# Patient Record
Sex: Male | Born: 2005 | Race: Black or African American | Hispanic: No | Marital: Single | Smoking: Never smoker
Health system: Southern US, Community
[De-identification: ages and names within clinical notes are randomized; demographics above are authoritative.]

## PROBLEM LIST (undated history)

## (undated) DIAGNOSIS — T7840XA Allergy, unspecified, initial encounter: Secondary | ICD-10-CM

## (undated) DIAGNOSIS — J45909 Unspecified asthma, uncomplicated: Secondary | ICD-10-CM

## (undated) HISTORY — PX: TYMPANOSTOMY TUBE PLACEMENT: SHX32

## (undated) HISTORY — PX: ADENOIDECTOMY: SUR15

## (undated) HISTORY — PX: TONSILLECTOMY: SUR1361

## (undated) HISTORY — DX: Allergy, unspecified, initial encounter: T78.40XA

---
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
## 2024-07-09 ENCOUNTER — Other Ambulatory Visit: Payer: Self-pay

## 2024-07-09 ENCOUNTER — Ambulatory Visit
Admission: EM | Admit: 2024-07-09 | Discharge: 2024-07-09 | Disposition: A | Discharge status: Home / Self Care | Attending: Physician Assistant | Admitting: Physician Assistant

## 2024-07-09 DIAGNOSIS — S61314A Laceration without foreign body of right ring finger with damage to nail, initial encounter: Secondary | ICD-10-CM | POA: Diagnosis not present

## 2024-07-09 DIAGNOSIS — Z23 Encounter for immunization: Secondary | ICD-10-CM

## 2024-07-09 MED ORDER — ACETAMINOPHEN 325 MG PO TABS
650.0000 mg | ORAL_TABLET | Freq: Once | ORAL | Status: AC
  Administered 2024-07-09: 650 mg via ORAL

## 2024-07-09 MED ORDER — TETANUS-DIPHTH-ACELL PERTUSSIS 5-2.5-18.5 LF-MCG/0.5 IM SUSY
0.5000 mL | PREFILLED_SYRINGE | Freq: Once | INTRAMUSCULAR | Status: AC
  Administered 2024-07-09: 0.5 mL via INTRAMUSCULAR

## 2024-07-09 NOTE — Discharge Instructions (Addendum)
 You were seen today for a laceration (cut) to your right ring finger We have cleansed the wound and closed it with a skin glue called Dermabond. You will not need to return to urgent care for removal. The Dermabond will come off on its own over the next 5-7 days. You can, and should, wash your hands like you normally would. Try not to submerge your finger, like in a bath or swimming, until it is completely healed. You can take Tylenol  and Ibuprofen  for pain If you notice any of the following please return to urgent care or go to the ED: swelling of the finger, inability to move the finger, severe pain, redness of the finger, drainage that looks like pus, fever and chills.

## 2024-07-09 NOTE — ED Provider Notes (Signed)
 [facility]    CSN: [id number] Arrival date & time: 07/09/24  1253      History   Chief Complaint Chief Complaint  Patient presents with   Finger Laceration     HPI Jon Cortez is a 18 y.o. male.   HPI  Pt presents today for concerns of a laceration to right ring finger that occurred approx 30 minutes PTA  He states he was at work and was cutting onions and accidentally cut his finger.  He washed the wound with water a few times at work  He applied paper towels to the area for bleeding control    Past Medical History:  Diagnosis Date   Allergy    Asthma     Patient Active Problem List   Diagnosis Date Noted   Asthma 02/20/2020    Past Surgical History:  Procedure Laterality Date   ADENOIDECTOMY     TONSILLECTOMY     TYMPANOSTOMY TUBE PLACEMENT         Home Medications    Prior to Admission medications   Medication Sig Start Date End Date Taking? Authorizing Provider  albuterol  (PROVENTIL ) (2.5 MG/3ML) 0.083% nebulizer solution Take 3 mLs (2.5 mg total) by nebulization every 4 (four) hours as needed for wheezing or shortness of breath. 02/20/20   Prentiss Dorothyann Maxwell, MD  loratadine (CLARITIN) 5 MG/5ML syrup Take 10 mg by mouth daily as needed for allergies.     [provider]  olopatadine  (PATANOL) 0.1 % ophthalmic solution Place 1 drop into both eyes 2 (two) times daily. Patient not taking: Reported on 02/21/2021 02/14/17   Kozlow, Eric J, MD  PROAIR  HFA 108 [phone] Base) MCG/ACT inhaler Inhale 2 puffs into the lungs every 6 (six) hours as needed for wheezing or shortness of breath. 08/18/20   Prentiss Dorothyann Maxwell, MD    Family History Family History  Problem Relation Age of Onset   Asthma Brother    Allergic rhinitis Brother    Heart disease Maternal Grandmother    Hypertension Maternal Grandmother    Heart failure Maternal Grandmother     Social History Social History   Tobacco Use   Smoking status: Never   Smokeless  tobacco: Never  Vaping Use   Vaping status: Never Used  Substance Use Topics   Alcohol use: No   Drug use: No     Allergies   Patient has no active allergies.   Review of Systems Review of Systems  Skin:  Positive for wound.     Physical Exam Triage Vital Signs ED Triage Vitals  Encounter Vitals Group     BP 07/09/24 1300 131/80     Girls Systolic BP Percentile --      Girls Diastolic BP Percentile --      Boys Systolic BP Percentile --      Boys Diastolic BP Percentile --      Pulse Rate 07/09/24 1300 64     Resp 07/09/24 1300 18     Temp 07/09/24 1300 (!) 97.5 F (36.4 C)     Temp Source 07/09/24 1300 Oral     SpO2 07/09/24 1300 97 %     Weight 07/09/24 1303 160 lb (72.6 kg)     Height 07/09/24 1303 5' 8 (1.727 m)     Head Circumference --      Peak Flow --      Pain Score 07/09/24 1302 0     Pain Loc --  Pain Education --      Exclude from Growth Chart --    No data found.  Updated Vital Signs BP 131/80 (BP Location: Right Arm)   Pulse 64   Temp (!) 97.5 F (36.4 C) (Oral)   Resp 18   Ht 5' 8 (1.727 m)   Wt 160 lb (72.6 kg)   SpO2 97%   BMI 24.33 kg/m   Visual Acuity Right Eye Distance:   Left Eye Distance:   Bilateral Distance:    Right Eye Near:   Left Eye Near:    Bilateral Near:     Physical Exam Vitals reviewed.  Constitutional:      General: He is awake.     Appearance: Normal appearance. He is well-developed and well-groomed.  HENT:     Head: Normocephalic and atraumatic.  Eyes:     Extraocular Movements: Extraocular movements intact.     Conjunctiva/sclera: Conjunctivae normal.  Pulmonary:     Effort: Pulmonary effort is normal.  Musculoskeletal:       Hands:     Cervical back: Normal range of motion.     Comments: Pt is able to bend all fingers of the right hand and extend fully. Intact cap refill. He has cut the distal aspect of the right ring fingernail but the body of the nail is intact.   Neurological:      Mental Status: He is alert and oriented to person, place, and time.  Psychiatric:        Attention and Perception: Attention normal.        Mood and Affect: Mood normal.        Speech: Speech normal.        Behavior: Behavior normal. Behavior is cooperative.      UC Treatments / Results  Labs (all labs ordered are listed, but only abnormal results are displayed) Labs Reviewed - No data to display  EKG   Radiology No results found.  Procedures Laceration Repair  Date/Time: 07/09/2024 1:22 PM  Performed by: Marylene Rocky BRAVO, PA-C Authorized by: Tacarra Justo E, PA-C   Consent:    Consent obtained:  Verbal   Consent given by:  Patient   Risks, benefits, and alternatives were discussed: yes     Risks discussed:  Infection, pain, poor wound healing and need for additional repair   Alternatives discussed:  No treatment, delayed treatment, observation and referral Universal protocol:    Procedure explained and questions answered to patient or proxy's satisfaction: yes     Patient identity confirmed:  Verbally with patient Anesthesia:    Anesthesia method:  None Laceration details:    Location:  Finger   Finger location:  R ring finger   Length (cm):  1   Depth (mm):  2 Pre-procedure details:    Preparation:  Patient was prepped and draped in usual sterile fashion Exploration:    Hemostasis achieved with:  Direct pressure and tourniquet   Wound exploration: wound explored through full range of motion and entire depth of wound visualized     Contaminated: no   Treatment:    Area cleansed with:  Chlorhexidine and povidone-iodine   Amount of cleaning:  Standard   Irrigation solution:  Tap water   Debridement:  None   Undermining:  None   Scar revision: no   Skin repair:    Repair method:  Tissue adhesive Approximation:    Approximation:  Loose Repair type:    Repair type:  Simple Post-procedure details:  Dressing:  Antibiotic ointment and adhesive bandage   Procedure  completion:  Tolerated well, no immediate complications  (including critical care time)  Medications Ordered in UC Medications  acetaminophen  (TYLENOL ) tablet 650 mg (650 mg Oral Given 07/09/24 1324)  Tdap (BOOSTRIX) injection 0.5 mL (0.5 mLs Intramuscular Given 07/09/24 1331)    Initial Impression / Assessment and Plan / UC Course  I have reviewed the triage vital signs and the nursing notes.  Pertinent labs & imaging results that were available during my care of the patient were reviewed by me and considered in my medical decision making (see chart for details).      Final Clinical Impressions(s) / UC Diagnoses   Final diagnoses:  Laceration of right ring finger without foreign body with damage to nail, initial encounter   Pt presents today for a laceration to the right ring finger that occurred approx 30 minutes PTA while at work- states he slicing onions and cut his finger. He has a round full thickness slice removed from the tip of his right ring finger with a small portion taken out of his nail but overall bed appears unaffected. He is able to flex and extend finger through full ROM and cap refill is <2 seconds at distal aspect of right ring finger. Laceration was covered with Dermabond after cleansing to help prevent further contamination. Pt reports he is up to date on childhood vaccines, Tdap provided today as it has apparently been 5+ years since most recent vaccination. Reviewed home care measures and provided these in AVS. ED and return precautions reviewed and provided in AVS.Follow up as needed for persistent or progressing symptoms     Discharge Instructions      You were seen today for a laceration (cut) to your right ring finger We have cleansed the wound and closed it with a skin glue called Dermabond. You will not need to return to urgent care for removal. The Dermabond will come off on its own over the next 5-7 days. You can, and should, wash your hands like you  normally would. Try not to submerge your finger, like in a bath or swimming, until it is completely healed. You can take Tylenol  and Ibuprofen  for pain If you notice any of the following please return to urgent care or go to the ED: swelling of the finger, inability to move the finger, severe pain, redness of the finger, drainage that looks like pus, fever and chills.      ED Prescriptions   None    PDMP not reviewed this encounter.   Marylene Rocky BRAVO, PA-C 07/09/24 1357

## 2024-07-09 NOTE — ED Triage Notes (Addendum)
 Pt presents to urgent care after accidentally cutting his right ring finger while slicing onions approximately thirty minutes PTA. Currently denies pain. Band-aids & paper towel applied after incident. Bleeding is controlled. No medications taken today.
